# Patient Record
Sex: Female | Born: 1970 | Race: White | Hispanic: No | Marital: Married | Smoking: Never smoker
Health system: Southern US, Community
[De-identification: ages and names within clinical notes are randomized; demographics above are authoritative.]

## PROBLEM LIST (undated history)

## (undated) DIAGNOSIS — B379 Candidiasis, unspecified: Secondary | ICD-10-CM

## (undated) DIAGNOSIS — R51 Headache: Secondary | ICD-10-CM

## (undated) DIAGNOSIS — C801 Malignant (primary) neoplasm, unspecified: Secondary | ICD-10-CM

## (undated) DIAGNOSIS — Z85810 Personal history of malignant neoplasm of tongue: Secondary | ICD-10-CM

## (undated) DIAGNOSIS — B9689 Other specified bacterial agents as the cause of diseases classified elsewhere: Secondary | ICD-10-CM

## (undated) DIAGNOSIS — N760 Acute vaginitis: Secondary | ICD-10-CM

## (undated) DIAGNOSIS — G43909 Migraine, unspecified, not intractable, without status migrainosus: Secondary | ICD-10-CM

## (undated) DIAGNOSIS — Z8759 Personal history of other complications of pregnancy, childbirth and the puerperium: Secondary | ICD-10-CM

## (undated) HISTORY — DX: Migraine, unspecified, not intractable, without status migrainosus: G43.909

## (undated) HISTORY — DX: Personal history of malignant neoplasm of tongue: Z85.810

## (undated) HISTORY — DX: Candidiasis, unspecified: B37.9

## (undated) HISTORY — DX: Headache: R51

## (undated) HISTORY — DX: Personal history of other complications of pregnancy, childbirth and the puerperium: Z87.59

## (undated) HISTORY — DX: Malignant (primary) neoplasm, unspecified: C80.1

## (undated) HISTORY — DX: Other specified bacterial agents as the cause of diseases classified elsewhere: B96.89

## (undated) HISTORY — PX: APPENDECTOMY: SHX54

## (undated) HISTORY — DX: Acute vaginitis: N76.0

---

## 1997-08-19 ENCOUNTER — Other Ambulatory Visit: Admission: RE | Admit: 1997-08-19 | Discharge: 1997-08-19 | Payer: Self-pay | Admitting: Obstetrics and Gynecology

## 1998-12-09 ENCOUNTER — Other Ambulatory Visit: Admission: RE | Admit: 1998-12-09 | Discharge: 1998-12-09 | Payer: Self-pay | Admitting: Obstetrics and Gynecology

## 1999-07-19 ENCOUNTER — Other Ambulatory Visit: Admission: RE | Admit: 1999-07-19 | Discharge: 1999-07-19 | Payer: Self-pay | Admitting: *Deleted

## 1999-11-03 ENCOUNTER — Encounter: Payer: Self-pay | Admitting: Obstetrics and Gynecology

## 1999-11-03 ENCOUNTER — Ambulatory Visit (HOSPITAL_COMMUNITY): Admission: RE | Admit: 1999-11-03 | Discharge: 1999-11-03 | Payer: Self-pay | Admitting: Obstetrics and Gynecology

## 1999-11-20 ENCOUNTER — Inpatient Hospital Stay (HOSPITAL_COMMUNITY): Admission: AD | Admit: 1999-11-20 | Discharge: 1999-11-20 | Payer: Self-pay | Admitting: Obstetrics & Gynecology

## 2000-01-20 ENCOUNTER — Ambulatory Visit (HOSPITAL_COMMUNITY): Admission: RE | Admit: 2000-01-20 | Discharge: 2000-01-20 | Payer: Self-pay | Admitting: Obstetrics and Gynecology

## 2000-01-20 ENCOUNTER — Encounter: Payer: Self-pay | Admitting: Obstetrics and Gynecology

## 2000-02-12 ENCOUNTER — Inpatient Hospital Stay (HOSPITAL_COMMUNITY): Admission: AD | Admit: 2000-02-12 | Discharge: 2000-02-15 | Payer: Self-pay | Admitting: Obstetrics and Gynecology

## 2000-02-17 ENCOUNTER — Encounter: Admission: RE | Admit: 2000-02-17 | Discharge: 2000-03-28 | Payer: Self-pay | Admitting: Obstetrics & Gynecology

## 2001-03-26 ENCOUNTER — Other Ambulatory Visit: Admission: RE | Admit: 2001-03-26 | Discharge: 2001-03-26 | Payer: Self-pay | Admitting: Obstetrics and Gynecology

## 2004-03-22 ENCOUNTER — Other Ambulatory Visit: Admission: RE | Admit: 2004-03-22 | Discharge: 2004-03-22 | Payer: Self-pay | Admitting: Obstetrics and Gynecology

## 2004-07-07 ENCOUNTER — Ambulatory Visit (HOSPITAL_COMMUNITY): Admission: RE | Admit: 2004-07-07 | Discharge: 2004-07-07 | Payer: Self-pay | Admitting: Obstetrics and Gynecology

## 2004-08-02 ENCOUNTER — Ambulatory Visit (HOSPITAL_COMMUNITY): Admission: RE | Admit: 2004-08-02 | Discharge: 2004-08-02 | Payer: Self-pay | Admitting: Obstetrics and Gynecology

## 2004-11-25 ENCOUNTER — Inpatient Hospital Stay (HOSPITAL_COMMUNITY): Admission: AD | Admit: 2004-11-25 | Discharge: 2004-11-26 | Payer: Self-pay | Admitting: Obstetrics and Gynecology

## 2010-04-17 ENCOUNTER — Encounter: Payer: Self-pay | Admitting: Obstetrics and Gynecology

## 2010-09-20 ENCOUNTER — Other Ambulatory Visit (HOSPITAL_COMMUNITY): Payer: Self-pay | Admitting: Obstetrics and Gynecology

## 2010-09-20 DIAGNOSIS — Z1231 Encounter for screening mammogram for malignant neoplasm of breast: Secondary | ICD-10-CM

## 2010-09-29 ENCOUNTER — Ambulatory Visit (HOSPITAL_COMMUNITY)
Admission: RE | Admit: 2010-09-29 | Discharge: 2010-09-29 | Disposition: A | Discharge status: Home / Self Care | Payer: BC Managed Care – PPO | Source: Ambulatory Visit | Attending: Obstetrics and Gynecology | Admitting: Obstetrics and Gynecology

## 2010-09-29 DIAGNOSIS — Z1231 Encounter for screening mammogram for malignant neoplasm of breast: Secondary | ICD-10-CM | POA: Insufficient documentation

## 2011-03-28 DIAGNOSIS — C801 Malignant (primary) neoplasm, unspecified: Secondary | ICD-10-CM

## 2011-03-28 HISTORY — DX: Malignant (primary) neoplasm, unspecified: C80.1

## 2011-03-28 HISTORY — PX: TONGUE SURGERY: SHX810

## 2011-07-03 ENCOUNTER — Encounter (INDEPENDENT_AMBULATORY_CARE_PROVIDER_SITE_OTHER): Payer: BC Managed Care – PPO | Admitting: Obstetrics and Gynecology

## 2011-07-03 DIAGNOSIS — Z3041 Encounter for surveillance of contraceptive pills: Secondary | ICD-10-CM

## 2011-09-19 ENCOUNTER — Ambulatory Visit: Payer: BC Managed Care – PPO | Admitting: Obstetrics and Gynecology

## 2013-12-05 ENCOUNTER — Other Ambulatory Visit (HOSPITAL_COMMUNITY): Payer: Self-pay | Admitting: Obstetrics and Gynecology

## 2013-12-05 DIAGNOSIS — Z1231 Encounter for screening mammogram for malignant neoplasm of breast: Secondary | ICD-10-CM

## 2013-12-11 ENCOUNTER — Ambulatory Visit (HOSPITAL_COMMUNITY)
Admission: RE | Admit: 2013-12-11 | Discharge: 2013-12-11 | Disposition: A | Discharge status: Home / Self Care | Payer: BC Managed Care – PPO | Source: Ambulatory Visit | Attending: Obstetrics and Gynecology | Admitting: Obstetrics and Gynecology

## 2013-12-11 DIAGNOSIS — Z1231 Encounter for screening mammogram for malignant neoplasm of breast: Secondary | ICD-10-CM | POA: Diagnosis present

## 2015-11-09 DIAGNOSIS — E7800 Pure hypercholesterolemia, unspecified: Secondary | ICD-10-CM | POA: Diagnosis not present

## 2015-11-09 DIAGNOSIS — Z23 Encounter for immunization: Secondary | ICD-10-CM | POA: Diagnosis not present

## 2015-11-09 DIAGNOSIS — F329 Major depressive disorder, single episode, unspecified: Secondary | ICD-10-CM | POA: Diagnosis not present

## 2015-11-09 DIAGNOSIS — J309 Allergic rhinitis, unspecified: Secondary | ICD-10-CM | POA: Diagnosis not present

## 2015-11-09 DIAGNOSIS — G43909 Migraine, unspecified, not intractable, without status migrainosus: Secondary | ICD-10-CM | POA: Diagnosis not present

## 2015-12-22 ENCOUNTER — Other Ambulatory Visit: Payer: Self-pay | Admitting: Obstetrics and Gynecology

## 2015-12-22 DIAGNOSIS — Z1231 Encounter for screening mammogram for malignant neoplasm of breast: Secondary | ICD-10-CM

## 2015-12-27 ENCOUNTER — Ambulatory Visit
Admission: RE | Admit: 2015-12-27 | Discharge: 2015-12-27 | Disposition: A | Discharge status: Home / Self Care | Payer: BLUE CROSS/BLUE SHIELD | Source: Ambulatory Visit | Attending: Obstetrics and Gynecology | Admitting: Obstetrics and Gynecology

## 2015-12-27 DIAGNOSIS — Z1231 Encounter for screening mammogram for malignant neoplasm of breast: Secondary | ICD-10-CM

## 2016-01-24 DIAGNOSIS — H02831 Dermatochalasis of right upper eyelid: Secondary | ICD-10-CM | POA: Diagnosis not present

## 2016-01-24 DIAGNOSIS — H02834 Dermatochalasis of left upper eyelid: Secondary | ICD-10-CM | POA: Diagnosis not present

## 2016-01-24 DIAGNOSIS — H02423 Myogenic ptosis of bilateral eyelids: Secondary | ICD-10-CM | POA: Diagnosis not present

## 2016-01-24 DIAGNOSIS — D485 Neoplasm of uncertain behavior of skin: Secondary | ICD-10-CM | POA: Diagnosis not present

## 2016-03-16 DIAGNOSIS — J0100 Acute maxillary sinusitis, unspecified: Secondary | ICD-10-CM | POA: Diagnosis not present

## 2016-05-04 DIAGNOSIS — D485 Neoplasm of uncertain behavior of skin: Secondary | ICD-10-CM | POA: Diagnosis not present

## 2016-05-04 DIAGNOSIS — L905 Scar conditions and fibrosis of skin: Secondary | ICD-10-CM | POA: Diagnosis not present

## 2016-06-26 DIAGNOSIS — J309 Allergic rhinitis, unspecified: Secondary | ICD-10-CM | POA: Diagnosis not present

## 2016-06-26 DIAGNOSIS — E7800 Pure hypercholesterolemia, unspecified: Secondary | ICD-10-CM | POA: Diagnosis not present

## 2016-06-26 DIAGNOSIS — F329 Major depressive disorder, single episode, unspecified: Secondary | ICD-10-CM | POA: Diagnosis not present

## 2016-06-26 DIAGNOSIS — G43909 Migraine, unspecified, not intractable, without status migrainosus: Secondary | ICD-10-CM | POA: Diagnosis not present

## 2016-12-11 DIAGNOSIS — Z23 Encounter for immunization: Secondary | ICD-10-CM | POA: Diagnosis not present

## 2016-12-11 DIAGNOSIS — L719 Rosacea, unspecified: Secondary | ICD-10-CM | POA: Diagnosis not present

## 2016-12-25 DIAGNOSIS — K1321 Leukoplakia of oral mucosa, including tongue: Secondary | ICD-10-CM | POA: Diagnosis not present

## 2016-12-29 DIAGNOSIS — K1321 Leukoplakia of oral mucosa, including tongue: Secondary | ICD-10-CM | POA: Diagnosis not present

## 2017-01-01 DIAGNOSIS — J309 Allergic rhinitis, unspecified: Secondary | ICD-10-CM | POA: Diagnosis not present

## 2017-01-01 DIAGNOSIS — G43909 Migraine, unspecified, not intractable, without status migrainosus: Secondary | ICD-10-CM | POA: Diagnosis not present

## 2017-01-01 DIAGNOSIS — E7800 Pure hypercholesterolemia, unspecified: Secondary | ICD-10-CM | POA: Diagnosis not present

## 2017-01-01 DIAGNOSIS — F339 Major depressive disorder, recurrent, unspecified: Secondary | ICD-10-CM | POA: Diagnosis not present

## 2017-01-01 DIAGNOSIS — Z23 Encounter for immunization: Secondary | ICD-10-CM | POA: Diagnosis not present

## 2017-07-16 DIAGNOSIS — F339 Major depressive disorder, recurrent, unspecified: Secondary | ICD-10-CM | POA: Diagnosis not present

## 2017-07-16 DIAGNOSIS — G43909 Migraine, unspecified, not intractable, without status migrainosus: Secondary | ICD-10-CM | POA: Diagnosis not present

## 2017-07-16 DIAGNOSIS — J309 Allergic rhinitis, unspecified: Secondary | ICD-10-CM | POA: Diagnosis not present

## 2017-07-16 DIAGNOSIS — E7800 Pure hypercholesterolemia, unspecified: Secondary | ICD-10-CM | POA: Diagnosis not present

## 2017-10-29 DIAGNOSIS — R1011 Right upper quadrant pain: Secondary | ICD-10-CM | POA: Diagnosis not present

## 2017-10-29 DIAGNOSIS — R197 Diarrhea, unspecified: Secondary | ICD-10-CM | POA: Diagnosis not present

## 2017-10-31 ENCOUNTER — Other Ambulatory Visit: Payer: Self-pay | Admitting: Family Medicine

## 2017-10-31 DIAGNOSIS — R1011 Right upper quadrant pain: Secondary | ICD-10-CM

## 2017-11-01 ENCOUNTER — Ambulatory Visit
Admission: RE | Admit: 2017-11-01 | Discharge: 2017-11-01 | Disposition: A | Discharge status: Home / Self Care | Payer: BLUE CROSS/BLUE SHIELD | Source: Ambulatory Visit | Attending: Family Medicine | Admitting: Family Medicine

## 2017-11-01 DIAGNOSIS — R1011 Right upper quadrant pain: Secondary | ICD-10-CM

## 2017-12-03 DIAGNOSIS — R1011 Right upper quadrant pain: Secondary | ICD-10-CM | POA: Diagnosis not present

## 2017-12-04 ENCOUNTER — Other Ambulatory Visit (HOSPITAL_COMMUNITY): Payer: Self-pay | Admitting: Surgery

## 2017-12-04 DIAGNOSIS — R1011 Right upper quadrant pain: Secondary | ICD-10-CM

## 2017-12-24 ENCOUNTER — Encounter (HOSPITAL_COMMUNITY)
Admission: RE | Admit: 2017-12-24 | Discharge: 2017-12-24 | Disposition: A | Discharge status: Home / Self Care | Payer: BLUE CROSS/BLUE SHIELD | Source: Ambulatory Visit | Attending: Surgery | Admitting: Surgery

## 2017-12-24 DIAGNOSIS — R1011 Right upper quadrant pain: Secondary | ICD-10-CM | POA: Insufficient documentation

## 2017-12-24 DIAGNOSIS — R109 Unspecified abdominal pain: Secondary | ICD-10-CM | POA: Diagnosis not present

## 2017-12-24 DIAGNOSIS — R110 Nausea: Secondary | ICD-10-CM | POA: Diagnosis not present

## 2017-12-24 MED ORDER — TECHNETIUM TC 99M MEBROFENIN IV KIT: 5.2000 | PACK | Freq: Once | INTRAVENOUS | Status: DC | PRN

## 2017-12-24 MED ORDER — FLUDEOXYGLUCOSE F - 18 (FDG) INJECTION: 5.2000 | Freq: Once | INTRAVENOUS | Status: DC | PRN

## 2018-01-14 DIAGNOSIS — E7800 Pure hypercholesterolemia, unspecified: Secondary | ICD-10-CM | POA: Diagnosis not present

## 2018-01-14 DIAGNOSIS — J309 Allergic rhinitis, unspecified: Secondary | ICD-10-CM | POA: Diagnosis not present

## 2018-01-14 DIAGNOSIS — F339 Major depressive disorder, recurrent, unspecified: Secondary | ICD-10-CM | POA: Diagnosis not present

## 2018-01-14 DIAGNOSIS — G43909 Migraine, unspecified, not intractable, without status migrainosus: Secondary | ICD-10-CM | POA: Diagnosis not present

## 2018-01-21 DIAGNOSIS — R1011 Right upper quadrant pain: Secondary | ICD-10-CM | POA: Diagnosis not present

## 2018-07-22 DIAGNOSIS — G43909 Migraine, unspecified, not intractable, without status migrainosus: Secondary | ICD-10-CM | POA: Diagnosis not present

## 2018-07-22 DIAGNOSIS — F339 Major depressive disorder, recurrent, unspecified: Secondary | ICD-10-CM | POA: Diagnosis not present

## 2018-07-22 DIAGNOSIS — J309 Allergic rhinitis, unspecified: Secondary | ICD-10-CM | POA: Diagnosis not present

## 2018-07-22 DIAGNOSIS — E7800 Pure hypercholesterolemia, unspecified: Secondary | ICD-10-CM | POA: Diagnosis not present

## 2018-10-25 DIAGNOSIS — L719 Rosacea, unspecified: Secondary | ICD-10-CM | POA: Diagnosis not present

## 2019-11-23 IMAGING — NM NM HEPATO W/GB/PHARM/[PERSON_NAME]
2 series · 12 of 12 positions shown · non-contrast
Comparison: None.

CLINICAL DATA: Abdominal pain and nausea

EXAM:
NUCLEAR MEDICINE HEPATOBILIARY IMAGING WITH GALLBLADDER EF
VIEWS:
Anterior right upper quadrant
RADIOPHARMACEUTICALS:  5.2 mCi Oc-11m  Choletec IV

[Series 1: biliary · 3.25mm/px · 6 of 60 frames shown]
[frame 6/60]
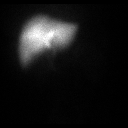
[frame 16/60]
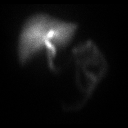
[frame 26/60]
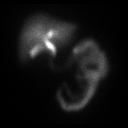
[frame 36/60]
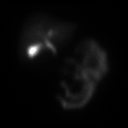
[frame 46/60]
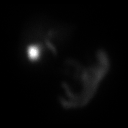
[frame 56/60]
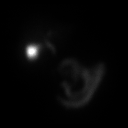

[Series 2: gbef · 3.25mm/px · 6 of 60 frames shown]
[frame 6/60]
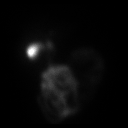
[frame 16/60]
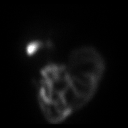
[frame 26/60]
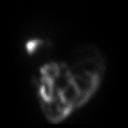
[frame 36/60]
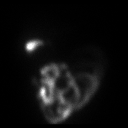
[frame 46/60]
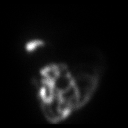
[frame 56/60]
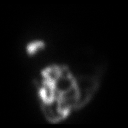

[12 of 12 positions shown; findings below may reference images not displayed]

FINDINGS: Liver uptake of radiotracer is normal. There is prompt visualization
of gallbladder and small bowel, indicating patency of the cystic and
common bile ducts. The patient consumed 8 ounces of Ensure orally
with calculation of the computer generated ejection fraction of
radiotracer from the gallbladder. The patient did not experience
clinical symptoms with the oral Ensure consumption. The computer
generated ejection fraction of radiotracer from the gallbladder is
normal at 62%, normal greater than 33% using the oral agent.
IMPRESSION: Study within normal limits.

## 2021-10-07 ENCOUNTER — Encounter: Payer: Self-pay | Admitting: Gastroenterology

## 2021-11-08 ENCOUNTER — Ambulatory Visit: Payer: BLUE CROSS/BLUE SHIELD | Admitting: Gastroenterology

## 2021-11-16 ENCOUNTER — Encounter: Payer: Self-pay | Admitting: Gastroenterology

## 2021-11-16 ENCOUNTER — Ambulatory Visit: Payer: Commercial Managed Care - HMO | Admitting: Gastroenterology

## 2021-11-16 VITALS — BP 122/82 | HR 75 | Ht 66.0 in | Wt 272.4 lb

## 2021-11-16 DIAGNOSIS — Z1211 Encounter for screening for malignant neoplasm of colon: Secondary | ICD-10-CM

## 2021-11-16 DIAGNOSIS — R194 Change in bowel habit: Secondary | ICD-10-CM

## 2021-11-16 DIAGNOSIS — K760 Fatty (change of) liver, not elsewhere classified: Secondary | ICD-10-CM

## 2021-11-16 DIAGNOSIS — R1011 Right upper quadrant pain: Secondary | ICD-10-CM

## 2021-11-16 DIAGNOSIS — R197 Diarrhea, unspecified: Secondary | ICD-10-CM

## 2021-11-16 DIAGNOSIS — R12 Heartburn: Secondary | ICD-10-CM

## 2021-11-16 DIAGNOSIS — K219 Gastro-esophageal reflux disease without esophagitis: Secondary | ICD-10-CM | POA: Diagnosis not present

## 2021-11-16 MED ORDER — DICYCLOMINE HCL 10 MG PO CAPS: 10.0000 mg | ORAL_CAPSULE | Freq: Four times a day (QID) | ORAL | 3 refills | Status: DC | PRN

## 2021-11-16 MED ORDER — CLENPIQ 10-3.5-12 MG-GM -GM/175ML PO SOLN: 1.0000 | Freq: Once | ORAL | 0 refills | Status: AC

## 2021-11-16 MED ORDER — CLENPIQ 10-3.5-12 MG-GM -GM/175ML PO SOLN: 1.0000 | Freq: Once | ORAL | 0 refills | Status: DC

## 2021-11-16 NOTE — Progress Notes (Signed)
Chief Complaint: Abdominal pain, diarrhea, GERD   HPI:     Amber Horne is a 51 y.o. female with a history of tongue cancer 2012, migraines, depression, referred to the [facility] for evaluation of abdominal pain, diarrhea, and GERD.  Reports having intermittent abdominal pain and diarrhea for the last 5+ years. Tends to be right sided abdominal pain. Can go weeks without pain, then recurs. Tends to be post prandial, and can be hours to a full day after eating exacerbating type foods. Worse with mulitple foods to include fried, artificial sweetener, potato chips, milk, garlic, onions, chocolate, greasy, spicy. Will typically have diarrhea with these episodes. No hematochezia or melena.  - 07/08/2012: Abdominal ultrasound: Increased hepatic echogenicity, otherwise normal - 11/01/2016: RUQ [place]: Minimal GB sludge, no gallstones, GB wall thickening.  Increased hepatic echogenicity - 12/24/2017: HIDA: Normal   Separately, history of GERD with index symptoms of heartburn, regurgitation for the last 3-4 years. No dysphagia. Takes OTC omeprazole 20 mg on demand, taking approximately 4-5 days/week. Now can have breakthrough with water alone.    Reviewed last set of labs from [facility]: - 05/30/2021: Normal CMP - 10/29/2017: Normal CBC  No previous EGD or colonoscopy.  No known family history of CRC, GI malignancy, liver disease, pancreatic disease, or IBD.    Past Medical History:  Diagnosis Date   Bacterial vaginosis    H/O hyperemesis gravidarum    H/O tongue cancer    Headache(784.0)    Migraines    Vulvovaginitis    Yeast infection      Past Surgical History:  Procedure Laterality Date   APPENDECTOMY     TONGUE SURGERY  2013   Family History  Problem Relation Age of Onset   Hypertension Mother    Hypertension Father    Cancer Maternal Grandmother        breast   Diabetes Paternal Grandmother    Stroke Paternal Grandmother    Colon cancer  Neg Hx    Esophageal cancer Neg Hx    Liver cancer Neg Hx    Pancreatic cancer Neg Hx    Rectal cancer Neg Hx    Social History   Tobacco Use   Smoking status: Never   Smokeless tobacco: Never  Vaping Use   Vaping Use: Never used  Substance Use Topics   Alcohol use: Never   Current Outpatient Medications  Medication Sig Dispense Refill   atenolol (TENORMIN) 25 MG tablet Take 25 mg by mouth daily.     citalopram (CELEXA) 20 MG tablet Take 20 mg by mouth daily.     fexofenadine (ALLEGRA) 180 MG tablet 1 tablet     omeprazole (PRILOSEC) 20 MG capsule Take 20 mg by mouth daily.     promethazine (PHENERGAN) 25 MG tablet Take 25 mg by mouth every 6 (six) hours as needed.     SUMAtriptan (IMITREX) 50 MG tablet ONE TABLET AT ONSET OF MIGRAINE HEADACHES MAY REPEAT IN 2 HOURS IF SYMPTOMS PERIST 30 DAYS     No current facility-administered medications for this visit.   Allergies  Allergen Reactions   Other     Cats & Elm Trees     Review of Systems: All systems reviewed and negative except where noted in HPI.     Physical Exam:    Wt Readings from Last 3 Encounters:  11/16/21 272 lb 6 oz (123.5 kg)    BP 122/82  Pulse 75   Ht 5\' 6"  (1.676 m)   Wt 272 lb 6 oz (123.5 kg)   SpO2 98%   BMI 43.96 kg/m  Constitutional:  Pleasant, in no acute distress. Psychiatric: Normal mood and affect. Behavior is normal. Cardiovascular: Normal rate, regular rhythm. No edema Pulmonary/chest: Effort normal and breath sounds normal. No wheezing, rales or rhonchi. Abdominal: Mild TTP in RUQ/RLQ without rebound or guarding.  No peritoneal signs.  Soft, nondistended. Bowel sounds active throughout. There are no masses palpable. No hepatomegaly. Neurological: Alert and oriented to person place and time. Skin: Skin is warm and dry. No rashes noted.   ASSESSMENT AND PLAN;   1) GERD 2) Heartburn - EGD to evaluate for erosive esophagitis, LES laxity, hiatal hernia along with Barrett's  Esophagus screening - Okay to continue OTC Prilosec for the time being - Continue antireflux lifestyle/dietary modifications with avoidance of exacerbating foods  3) RUQ pain 4) Diarrhea - Prior imaging studies have been essentially unrevealing for etiology.  Otherwise recent normal CMP and previously normal CBC - EGD and colonoscopy to evaluate for mucosal/luminal pathology with random and directed biopsies to r/o IBD, microscopic colitis, - Trial course of Bentyl - Avoid exacerbating foods  5) Colon cancer screening - Due for age-appropriate, average risk screening - Colonoscopy scheduled  6) Hepatic steatosis 7) Obesity (BMI 43.9) - RUQ with hepatic steatosis.  Otherwise normal liver enzymes   The indications, risks, and benefits of EGD and colonoscopy were explained to the patient in detail. Risks include but are not limited to bleeding, perforation, adverse reaction to medications, and cardiopulmonary compromise. Sequelae include but are not limited to the possibility of surgery, hospitalization, and mortality. The patient verbalized understanding and wished to proceed. All questions answered, referred to scheduler and bowel prep ordered. Further recommendations pending results of the exam.      [place] Tyrelle Raczka, DO, FACG  11/16/2021, 8:36 AM   No ref. provider found

## 2021-11-16 NOTE — Patient Instructions (Addendum)
If you are age 52 or younger, your body mass index should be between 19-25. Your Body mass index is 43.96 kg/m. If this is out of the aformentioned range listed, please consider follow up with your Primary Care Provider.   __________________________________________________________  The  GI providers would like to encourage you to use [facility] to communicate with providers for non-urgent requests or questions.  Due to long hold times on the telephone, sending your provider a message by [facility] may be a faster and more efficient way to get a response.  Please allow 48 business hours for a response.  Please remember that this is for non-urgent requests.   Due to recent changes in healthcare laws, you may see the results of your imaging and laboratory studies on MyChart before your provider has had a chance to review them.  We understand that in some cases there may be results that are confusing or concerning to you. Not all laboratory results come back in the same time frame and the provider may be waiting for multiple results in order to interpret others.  Please give [place] 48 hours in order for your provider to thoroughly review all the results before contacting the office for clarification of your results.    You have been scheduled for an endoscopy and colonoscopy. Please follow the written instructions given to you at your visit today. Please pick up your prep supplies at the pharmacy within the next 1-3 days. If you use inhalers (even only as needed), please bring them with you on the day of your procedure.   We have sent the following medications to your pharmacy for you to pick up at your convenience: Clenpiq, bentyl  Thank you for choosing me and  Gastroenterology.  Vito Cirigliano, D.O.

## 2021-11-24 ENCOUNTER — Other Ambulatory Visit: Payer: Self-pay | Admitting: Gastroenterology

## 2021-11-30 ENCOUNTER — Telehealth: Payer: Self-pay | Admitting: Gastroenterology

## 2021-11-30 NOTE — Telephone Encounter (Signed)
Left message for pt to call back  °

## 2021-11-30 NOTE — Telephone Encounter (Signed)
Patient called, states she having severe cough and would like to know if she could proceed with procedure 9/8. Requesting a call back as soon as possible. Please call to advise.

## 2021-11-30 NOTE — Telephone Encounter (Signed)
Pt states she started having congestion and coughing last week on 8/30. Pt denies fever. Pt also reports that son had similar symptoms and has been sick for the last 3 weeks and was diagnosed with a sinus infection. Per Dr. Barron Alvine, pt should follow up with PCP and then can reschedule procedure once cough and congestion clears up. Pt verbalized understanding and had no other concerns at end of call. Procedure on 9/8 cancelled.

## 2021-12-02 ENCOUNTER — Encounter: Payer: Commercial Managed Care - HMO | Admitting: Gastroenterology

## 2022-01-05 ENCOUNTER — Other Ambulatory Visit: Payer: Self-pay | Admitting: Family Medicine

## 2022-01-05 DIAGNOSIS — Z1231 Encounter for screening mammogram for malignant neoplasm of breast: Secondary | ICD-10-CM

## 2022-06-05 ENCOUNTER — Ambulatory Visit
Admission: RE | Admit: 2022-06-05 | Discharge: 2022-06-05 | Disposition: A | Discharge status: Home / Self Care | Payer: 59 | Source: Ambulatory Visit | Attending: Family Medicine | Admitting: Family Medicine

## 2022-06-05 DIAGNOSIS — Z1231 Encounter for screening mammogram for malignant neoplasm of breast: Secondary | ICD-10-CM

## 2023-02-20 ENCOUNTER — Other Ambulatory Visit (HOSPITAL_BASED_OUTPATIENT_CLINIC_OR_DEPARTMENT_OTHER): Payer: Self-pay | Admitting: Family Medicine

## 2023-02-20 ENCOUNTER — Telehealth (HOSPITAL_BASED_OUTPATIENT_CLINIC_OR_DEPARTMENT_OTHER): Payer: Self-pay

## 2023-02-20 DIAGNOSIS — E7800 Pure hypercholesterolemia, unspecified: Secondary | ICD-10-CM

## 2023-03-16 ENCOUNTER — Ambulatory Visit (HOSPITAL_BASED_OUTPATIENT_CLINIC_OR_DEPARTMENT_OTHER)
Admission: RE | Admit: 2023-03-16 | Discharge: 2023-03-16 | Disposition: A | Discharge status: Home / Self Care | Payer: Self-pay | Source: Ambulatory Visit | Attending: Family Medicine | Admitting: Family Medicine

## 2023-03-16 DIAGNOSIS — E7800 Pure hypercholesterolemia, unspecified: Secondary | ICD-10-CM | POA: Insufficient documentation

## 2023-03-19 ENCOUNTER — Telehealth: Payer: Self-pay | Admitting: Gastroenterology

## 2023-03-19 NOTE — Telephone Encounter (Signed)
PT was scheduled to have EGD/colon last year and unfortunately because of covid she was not able to go forth with having it done. Can we proceed with scheduling both procedures or will she need to come back in for doctor's recommendations. Please advise.

## 2023-03-19 NOTE — Telephone Encounter (Signed)
Dr C-  Are you okay with patient having direct endoscopy along with colonoscopy as previously recommended in 2023?

## 2023-04-24 ENCOUNTER — Ambulatory Visit (AMBULATORY_SURGERY_CENTER): Payer: 59

## 2023-04-24 VITALS — Ht 66.0 in | Wt 272.0 lb

## 2023-04-24 DIAGNOSIS — Z1211 Encounter for screening for malignant neoplasm of colon: Secondary | ICD-10-CM

## 2023-04-24 DIAGNOSIS — K219 Gastro-esophageal reflux disease without esophagitis: Secondary | ICD-10-CM

## 2023-04-24 DIAGNOSIS — R1011 Right upper quadrant pain: Secondary | ICD-10-CM

## 2023-04-24 MED ORDER — SUFLAVE 178.7 G PO SOLR: 1.0000 | ORAL | 0 refills | Status: DC

## 2023-04-24 NOTE — Progress Notes (Signed)

## 2023-05-15 ENCOUNTER — Encounter: Payer: Self-pay | Admitting: Gastroenterology

## 2023-05-17 ENCOUNTER — Encounter: Payer: Self-pay | Admitting: Gastroenterology

## 2023-05-17 ENCOUNTER — Ambulatory Visit: Payer: Self-pay | Admitting: Gastroenterology

## 2023-05-17 VITALS — BP 135/75 | HR 59 | Temp 97.3°F | Resp 14 | Ht 66.0 in | Wt 272.0 lb

## 2023-05-17 DIAGNOSIS — Z1211 Encounter for screening for malignant neoplasm of colon: Secondary | ICD-10-CM | POA: Diagnosis present

## 2023-05-17 DIAGNOSIS — K6289 Other specified diseases of anus and rectum: Secondary | ICD-10-CM

## 2023-05-17 DIAGNOSIS — K219 Gastro-esophageal reflux disease without esophagitis: Secondary | ICD-10-CM

## 2023-05-17 DIAGNOSIS — R194 Change in bowel habit: Secondary | ICD-10-CM

## 2023-05-17 DIAGNOSIS — K644 Residual hemorrhoidal skin tags: Secondary | ICD-10-CM

## 2023-05-17 DIAGNOSIS — K2100 Gastro-esophageal reflux disease with esophagitis, without bleeding: Secondary | ICD-10-CM | POA: Diagnosis not present

## 2023-05-17 DIAGNOSIS — D120 Benign neoplasm of cecum: Secondary | ICD-10-CM

## 2023-05-17 DIAGNOSIS — R197 Diarrhea, unspecified: Secondary | ICD-10-CM

## 2023-05-17 DIAGNOSIS — R1011 Right upper quadrant pain: Secondary | ICD-10-CM

## 2023-05-17 DIAGNOSIS — R12 Heartburn: Secondary | ICD-10-CM

## 2023-05-17 HISTORY — PX: COLONOSCOPY WITH ESOPHAGOGASTRODUODENOSCOPY (EGD): SHX5779

## 2023-05-17 MED ORDER — OMEPRAZOLE 40 MG PO CPDR: 40.0000 mg | DELAYED_RELEASE_CAPSULE | Freq: Every day | ORAL | 3 refills | Status: DC

## 2023-05-17 MED ORDER — SODIUM CHLORIDE 0.9 % IV SOLN: 500.0000 mL | INTRAVENOUS | Status: DC

## 2023-05-17 NOTE — Progress Notes (Signed)
 Patient states there have been no changes to medical or surgical history since time of pre-visit.

## 2023-05-17 NOTE — Progress Notes (Signed)
 GASTROENTEROLOGY PROCEDURE H&P NOTE   Primary Care Physician: Default, Provider, MD    Reason for Procedure:  GERD, abdominal pain, heartburn, regurgitation, colon cancer screening, diarrhea  Plan:    EGD, colonoscopy  Patient is appropriate for endoscopic procedure(s) in the ambulatory ([facility]) setting.  The nature of the procedure, as well as the risks, benefits, and alternatives were carefully and thoroughly reviewed with the patient. Ample time for discussion and questions allowed. The patient understood, was satisfied, and agreed to proceed.     HPI: Amber Horne is a 53 y.o. female who presents for EGD for evaluation of GERD, RUQ pain, heartburn, regurgitation, along with colonoscopy for CRC screening and diarrhea eval.  Past Medical History:  Diagnosis Date   Bacterial vaginosis    Cancer (HCC)    H/O hyperemesis gravidarum    H/O tongue cancer    Headache(784.0)    Migraines    Vulvovaginitis    Yeast infection     Past Surgical History:  Procedure Laterality Date   APPENDECTOMY     TONGUE SURGERY  2013    Prior to Admission medications   Medication Sig Start Date End Date Taking? Authorizing Provider  atenolol (TENORMIN) 25 MG tablet Take 25 mg by mouth daily. 10/22/21   [provider]  Azelaic Acid (FINACEA) 15 % gel Apply 1 Application topically 2 (two) times daily. 01/16/16   [provider]  citalopram (CELEXA) 20 MG tablet Take 20 mg by mouth daily. 10/22/21   [provider]  dicyclomine (BENTYL) 10 MG capsule Take 1 capsule (10 mg total) by mouth every 6 (six) hours as needed for spasms. Patient not taking: Reported on 04/24/2023 11/16/21   Shilee Biggs, Gae Bon V, DO  doxycycline (PERIOSTAT) 20 MG tablet Take 20 mg by mouth 2 (two) times daily as needed. 04/03/23   [provider]  fexofenadine (ALLEGRA) 180 MG tablet 1 tablet 02/15/09   [provider]  fluticasone (FLONASE) 50 MCG/ACT nasal spray Place 1 spray  into both nostrils daily.    [provider]  ibuprofen (ADVIL) 400 MG tablet Take 400 mg by mouth every 8 (eight) hours as needed.    [provider]  omeprazole (PRILOSEC) 20 MG capsule Take 20 mg by mouth daily.    [provider]  PEG 3350-KCl-NaCl-NaSulf-MgSul (SUFLAVE) 178.7 g SOLR Take 1 kit by mouth as directed. 04/24/23   Ahmaad Neidhardt V, DO  promethazine (PHENERGAN) 25 MG tablet Take 25 mg by mouth every 6 (six) hours as needed. 05/30/21   [provider]  SUMAtriptan (IMITREX) 50 MG tablet ONE TABLET AT ONSET OF MIGRAINE HEADACHES MAY REPEAT IN 2 HOURS IF SYMPTOMS PERIST 30 DAYS    [provider]    Current Outpatient Medications  Medication Sig Dispense Refill   atenolol (TENORMIN) 25 MG tablet Take 25 mg by mouth daily.     Azelaic Acid (FINACEA) 15 % gel Apply 1 Application topically 2 (two) times daily.     citalopram (CELEXA) 20 MG tablet Take 20 mg by mouth daily.     dicyclomine (BENTYL) 10 MG capsule Take 1 capsule (10 mg total) by mouth every 6 (six) hours as needed for spasms. (Patient not taking: Reported on 04/24/2023) 60 capsule 3   doxycycline (PERIOSTAT) 20 MG tablet Take 20 mg by mouth 2 (two) times daily as needed.     fexofenadine (ALLEGRA) 180 MG tablet 1 tablet     fluticasone (FLONASE) 50 MCG/ACT nasal spray Place 1  spray into both nostrils daily.     ibuprofen (ADVIL) 400 MG tablet Take 400 mg by mouth every 8 (eight) hours as needed.     omeprazole (PRILOSEC) 20 MG capsule Take 20 mg by mouth daily.     PEG 3350-KCl-NaCl-NaSulf-MgSul (SUFLAVE) 178.7 g SOLR Take 1 kit by mouth as directed. 1 each 0   promethazine (PHENERGAN) 25 MG tablet Take 25 mg by mouth every 6 (six) hours as needed.     SUMAtriptan (IMITREX) 50 MG tablet ONE TABLET AT ONSET OF MIGRAINE HEADACHES MAY REPEAT IN 2 HOURS IF SYMPTOMS PERIST 30 DAYS     No current facility-administered medications for this visit.    Allergies as of 05/17/2023 -  Review Complete 04/24/2023  Allergen Reaction Noted   Other  09/13/2011    Family History  Problem Relation Age of Onset   Hypertension Mother    Hypertension Father    Cancer Maternal Grandmother        breast   Diabetes Paternal Grandmother    Stroke Paternal Grandmother    Colon cancer Neg Hx    Esophageal cancer Neg Hx    Liver cancer Neg Hx    Pancreatic cancer Neg Hx    Rectal cancer Neg Hx    Stomach cancer Neg Hx     Social History   Socioeconomic History   Marital status: Married    Spouse name: Not on file   Number of children: Not on file   Years of education: Not on file   Highest education level: Not on file  Occupational History   Not on file  Tobacco Use   Smoking status: Never   Smokeless tobacco: Never  Vaping Use   Vaping status: Never Used  Substance and Sexual Activity   Alcohol use: Never   Drug use: Never   Sexual activity: Not on file  Other Topics Concern   Not on file  Social History Narrative   Not on file   Social Drivers of Health   Financial Resource Strain: Not on file  Food Insecurity: Not on file  Transportation Needs: Not on file  Physical Activity: Not on file  Stress: Not on file  Social Connections: Not on file  Intimate Partner Violence: Not on file    Physical Exam: Vital signs in last 24 hours: @LMP  12/03/2017 (Approximate)  GEN: NAD EYE: Sclerae anicteric ENT: MMM CV: Non-tachycardic Pulm: CTA b/l GI: Soft, NT/ND NEURO:  Alert & Oriented x 3   Doristine Locks, DO [facility]   05/17/2023 2:26 PM

## 2023-05-17 NOTE — Op Note (Signed)
 [facility] Patient Name: Amber Horne Procedure Date: 05/17/2023 2:21 PM MRN: [id number] Endoscopist: Doristine Locks , MD, [id number] Age: 53 Referring MD:  Date of Birth: [birth year] Gender: Female Account #: [id number] Procedure:                Upper GI endoscopy Indications:              Abdominal pain in the right upper quadrant,                            Heartburn, Suspected esophageal reflux, Diarrhea Medicines:                Monitored Anesthesia Care Procedure:                Pre-Anesthesia Assessment:                           - Prior to the procedure, a History and Physical                            was performed, and patient medications and                            allergies were reviewed. The patient's tolerance of                            previous anesthesia was also reviewed. The risks                            and benefits of the procedure and the sedation                            options and risks were discussed with the patient.                            All questions were answered, and informed consent                            was obtained. Prior Anticoagulants: The patient has                            taken no anticoagulant or antiplatelet agents. ASA                            Grade Assessment: III - A patient with severe                            systemic disease. After reviewing the risks and                            benefits, the patient was deemed in satisfactory                            condition to undergo the procedure.  After obtaining informed consent, the endoscope was                            passed under direct vision. Throughout the                            procedure, the patient's blood pressure, pulse, and                            oxygen saturations were monitored continuously. The                            Olympus Scope [id number] was introduced through the                             mouth, and advanced to the second part of duodenum.                            The upper GI endoscopy was accomplished without                            difficulty. The patient tolerated the procedure                            well. Scope In: Scope Out: Findings:                 LA Grade A (one or more mucosal breaks less than 5                            mm, not extending between tops of 2 mucosal folds)                            esophagitis with no bleeding was found in the lower                            third of the esophagus.                           The gastroesophageal flap valve was visualized                            endoscopically and classified as Hill Grade II                            (fold present, opens with respiration).                           The entire examined stomach was normal. Biopsies                            were taken with a cold forceps for histologya nd  Helicobacter pylori testing. Estimated blood loss                            was minimal.                           The examined duodenum was normal. Biopsies for                            histology were taken with a cold forceps for                            histology and evaluation of celiac disease.                            Estimated blood loss was minimal. Complications:            No immediate complications. Estimated Blood Loss:     Estimated blood loss was minimal. Impression:               - LA Grade A reflux esophagitis with no bleeding.                           - Gastroesophageal flap valve classified as Hill                            Grade II (fold present, opens with respiration).                           - Normal stomach. Biopsied.                           - Normal examined duodenum. Biopsied. Recommendation:           - Patient has a contact number available for                            emergencies. The signs and symptoms of potential                             delayed complications were discussed with the                            patient. Return to normal activities tomorrow.                            Written discharge instructions were provided to the                            patient.                           - Resume previous diet.                           - Continue present medications.                           -  Await pathology results.                           - Increase Prilosec (omeprazole) to 40 mg PO BID                            for 6 weeks to promote mucosal healing of erosive                            esophagitis, then can reduce back to 40 mg daily                            and titrate to the lowest efefctive dose to control                            reflux symptoms. Doristine Locks, MD 05/17/2023 3:29:51 PM

## 2023-05-17 NOTE — Progress Notes (Signed)
 Called to room to assist during endoscopic procedure.  Patient ID and intended procedure confirmed with present staff. Received instructions for my participation in the procedure from the performing physician.

## 2023-05-17 NOTE — Op Note (Signed)
 [facility] Patient Name: Amber Horne Procedure Date: 05/17/2023 2:20 PM MRN: [id number] Endoscopist: Doristine Locks , MD, [id number] Age: 53 Referring MD:  Date of Birth: 12-Apr-1970 Gender: Female Account #: [id number] Procedure:                Colonoscopy Indications:              Screening for colorectal malignant neoplasm, This                            is the patient's first colonoscopy.                           Separately, has been having intermittent non-bloody                            diarrhea. Medicines:                Monitored Anesthesia Care Procedure:                Pre-Anesthesia Assessment:                           - Prior to the procedure, a History and Physical                            was performed, and patient medications and                            allergies were reviewed. The patient's tolerance of                            previous anesthesia was also reviewed. The risks                            and benefits of the procedure and the sedation                            options and risks were discussed with the patient.                            All questions were answered, and informed consent                            was obtained. Prior Anticoagulants: The patient has                            taken no anticoagulant or antiplatelet agents. ASA                            Grade Assessment: III - A patient with severe                            systemic disease. After reviewing the risks and  benefits, the patient was deemed in satisfactory                            condition to undergo the procedure.                           After obtaining informed consent, the colonoscope                            was passed under direct vision. Throughout the                            procedure, the patient's blood pressure, pulse, and                            oxygen saturations were monitored continuously. The                             CF [id number] #[id number] was introduced through the anus                            and advanced to the the terminal ileum. The                            colonoscopy was performed without difficulty. The                            patient tolerated the procedure well. The quality                            of the bowel preparation was good. The terminal                            ileum, ileocecal valve, appendiceal orifice, and                            rectum were photographed. Scope In: 3:02:51 PM Scope Out: 3:22:48 PM Scope Withdrawal Time: 0 hours 15 minutes 28 seconds  Total Procedure Duration: 0 hours 19 minutes 57 seconds  Findings:                 Skin tags were found on perianal exam.                           A 4 mm polyp was found in the cecum. The polyp was                            sessile. The polyp was removed with a cold snare.                            Resection and retrieval were complete. Estimated                            blood loss was minimal.  Normal mucosa was found in the entire colon.                            Biopsies for histology were taken with a cold                            forceps from the right colon and left colon for                            evaluation of microscopic colitis. Estimated blood                            loss was minimal.                           Small hypertrophied anal papillae noted on                            retroflexion.                           The terminal ileum appeared normal. Complications:            No immediate complications. Estimated Blood Loss:     Estimated blood loss was minimal. Impression:               - Perianal skin tags found on perianal exam.                           - One 4 mm polyp in the cecum, removed with a cold                            snare. Resected and retrieved.                           - Normal mucosa in the entire examined colon.                             Biopsied.                           - Anal papilla(e) were hypertrophied.                           - The examined portion of the ileum was normal. Recommendation:           - Patient has a contact number available for                            emergencies. The signs and symptoms of potential                            delayed complications were discussed with the                            patient. Return to normal activities tomorrow.  Written discharge instructions were provided to the                            patient.                           - Resume previous diet.                           - Continue present medications.                           - Await pathology results.                           - Repeat colonoscopy for surveillance based on                            pathology results.                           - Return to GI office PRN. Doristine Locks, MD 05/17/2023 3:33:27 PM

## 2023-05-17 NOTE — Patient Instructions (Signed)

## 2023-05-17 NOTE — Progress Notes (Signed)
 Vss nad trans to pacu

## 2023-05-18 ENCOUNTER — Telehealth: Payer: Self-pay

## 2023-05-18 NOTE — Telephone Encounter (Signed)
  Follow up Call-     05/17/2023    2:35 PM  Call back number  Post procedure Call Back phone  # [phone]  Permission to leave phone message Yes     Patient questions:  Do you have a fever, pain , or abdominal swelling? No. Pain Score  0 *  Have you tolerated food without any problems? Yes.    Have you been able to return to your normal activities? Yes.    Do you have any questions about your discharge instructions: Diet   No. Medications  No. Follow up visit  No.  Do you have questions or concerns about your Care? No.  Actions: * If pain score is 4 or above: No action needed, pain <4.

## 2023-05-22 ENCOUNTER — Encounter: Payer: Self-pay | Admitting: Gastroenterology

## 2023-05-22 LAB — SURGICAL PATHOLOGY

## 2023-06-06 ENCOUNTER — Encounter: Payer: Self-pay | Admitting: Diagnostic Neuroimaging

## 2023-06-06 ENCOUNTER — Ambulatory Visit: Payer: BC Managed Care – PPO | Admitting: Diagnostic Neuroimaging

## 2023-06-06 VITALS — BP 144/76 | HR 60 | Ht 66.0 in | Wt 265.0 lb

## 2023-06-06 DIAGNOSIS — R200 Anesthesia of skin: Secondary | ICD-10-CM

## 2023-06-06 NOTE — Patient Instructions (Signed)
 INTERMITTENT NUMBNESS IN HANDS  - neuro exam is normal; no signs of CMT on exam; paternal cousin had positive testing for CMT (severe symptoms at young age); other family members had possible CMT and mild symptoms, but without genetic testing - consider medical genetic consult if interested in pursing testing - consider topical treatments for wrist / hand pain (dicofenac, lidocaine, ice) - consider wrist splint at night time

## 2023-06-06 NOTE — Progress Notes (Signed)
 [facility]  PATIENT: Amber Horne DOB: 1970/09/19  REFERRING CLINICIAN: Tally Joe, MD HISTORY FROM: PATIENT  REASON FOR VISIT: NEW CONSULT   HISTORICAL  CHIEF COMPLAINT:  Chief Complaint  Patient presents with   Extremity Weakness    Rm 7 alone  Pt is well, reports she has been having BUE numbness for about 4-5 years. Symptoms have been progressed over time.  CMT runs in her family.     HISTORY OF PRESENT ILLNESS:   52 year old female here for evaluation of intermittent numbness in hands.  Symptoms started around 2020.  At that time had some evaluation for carpal tunnel syndrome but apparently this was negative.  Has a family history of CMT neuropathy and a paternal cousin who had positive genetic testing.  He had severe symptoms in childhood and continued disability throughout his life.  Paternal grandmother, paternal uncle and patient's father had some mild symptoms but were not genetically tested.     REVIEW OF SYSTEMS: Full 14 system review of systems performed and negative with exception of: as per HPI.  ALLERGIES: Allergies  Allergen Reactions   Other Itching and Other (See Comments)    Cats & Elm Trees; congestion    HOME MEDICATIONS: Outpatient Medications Prior to Visit  Medication Sig Dispense Refill   atenolol (TENORMIN) 25 MG tablet Take 25 mg by mouth daily.     Azelaic Acid (FINACEA) 15 % gel Apply 1 Application topically 2 (two) times daily.     citalopram (CELEXA) 20 MG tablet Take 20 mg by mouth daily.     doxycycline (PERIOSTAT) 20 MG tablet Take 20 mg by mouth 2 (two) times daily as needed.     fexofenadine (ALLEGRA) 180 MG tablet 1 tablet     fluticasone (FLONASE) 50 MCG/ACT nasal spray Place 1 spray into both nostrils daily.     omeprazole (PRILOSEC) 40 MG capsule Take 1 capsule (40 mg total) by mouth daily. Take 40 mg twice daily for 6 weeks, then decrease to 40 mg once daily 90 capsule 3   promethazine (PHENERGAN) 25  MG tablet Take 25 mg by mouth every 6 (six) hours as needed.     SUMAtriptan (IMITREX) 50 MG tablet ONE TABLET AT ONSET OF MIGRAINE HEADACHES MAY REPEAT IN 2 HOURS IF SYMPTOMS PERIST 30 DAYS     No facility-administered medications prior to visit.    PAST MEDICAL HISTORY: Past Medical History:  Diagnosis Date   Bacterial vaginosis    Cancer (HCC) 2013   tongue   H/O hyperemesis gravidarum    H/O tongue cancer    Headache(784.0)    Migraines    Vulvovaginitis    Yeast infection     PAST SURGICAL HISTORY: Past Surgical History:  Procedure Laterality Date   APPENDECTOMY     COLONOSCOPY WITH ESOPHAGOGASTRODUODENOSCOPY (EGD)  05/17/2023   [facility]ito Cirigliano at [facility], GERD with esophagitis   TONGUE SURGERY  2013    FAMILY HISTORY: Family History  Problem Relation Age of Onset   Hypertension Mother    Hypertension Father    Cancer Maternal Grandmother        breast   Diabetes Paternal Grandmother    Stroke Paternal Grandmother    Colon cancer Neg Hx    Esophageal cancer Neg Hx    Liver cancer Neg Hx    Pancreatic cancer Neg Hx    Rectal cancer Neg Hx    Stomach cancer Neg Hx     SOCIAL HISTORY: Social History  Socioeconomic History   Marital status: Married    Spouse name: Not on file   Number of children: Not on file   Years of education: Not on file   Highest education level: Not on file  Occupational History   Not on file  Tobacco Use   Smoking status: Never   Smokeless tobacco: Never  Vaping Use   Vaping status: Never Used  Substance and Sexual Activity   Alcohol use: Never   Drug use: Never   Sexual activity: Not on file  Other Topics Concern   Not on file  Social History Narrative   Not on file   Social Drivers of Health   Financial Resource Strain: Not on file  Food Insecurity: Not on file  Transportation Needs: Not on file  Physical Activity: Not on file  Stress: Not on file  Social Connections: Not on file  Intimate Partner Violence: Not on  file     PHYSICAL EXAM  GENERAL EXAM/CONSTITUTIONAL: Vitals:  Vitals:   06/06/23 0816  BP: (!) 144/76  Pulse: 60  Weight: 265 lb (120.2 kg)  Height: 5\' 6"  (1.676 m)   Body mass index is 42.77 kg/m. Wt Readings from Last 3 Encounters:  06/06/23 265 lb (120.2 kg)  05/17/23 272 lb (123.4 kg)  04/24/23 272 lb (123.4 kg)   Patient is in no distress; well developed, nourished and groomed; neck is supple  CARDIOVASCULAR: Examination of carotid arteries is normal; no carotid bruits Regular rate and rhythm, no murmurs Examination of peripheral vascular system by observation and palpation is normal  EYES: Ophthalmoscopic exam of optic discs and posterior segments is normal; no papilledema or hemorrhages No results found.  MUSCULOSKELETAL: Gait, strength, tone, movements noted in Neurologic exam below  NEUROLOGIC: MENTAL STATUS:      No data to display         awake, alert, oriented to person, place and time recent and remote memory intact normal attention and concentration language fluent, comprehension intact, naming intact fund of knowledge appropriate  CRANIAL NERVE:  2nd - no papilledema on fundoscopic exam 2nd, 3rd, 4th, 6th - pupils equal and reactive to light, visual fields full to confrontation, extraocular muscles intact, no nystagmus 5th - facial sensation symmetric 7th - facial strength symmetric 8th - hearing intact 9th - palate elevates symmetrically, uvula midline 11th - shoulder shrug symmetric 12th - tongue protrusion midline  MOTOR:  normal bulk and tone, full strength in the BUE, BLE NORMAL ARCHES; NO HAMMERTOES; BILATERAL BUNION FORMATION AT BASE OF GREAT TOES  SENSORY:  normal and symmetric to light touch, pinprick, temperature, vibration NEGATIVE PHALENS; NEGATIVE TINELS  COORDINATION:  finger-nose-finger, fine finger movements normal  REFLEXES:  deep tendon reflexes 1+ and symmetric  GAIT/STATION:  narrow based  gait     DIAGNOSTIC DATA (LABS, IMAGING, TESTING) - I reviewed patient records, labs, notes, testing and imaging myself where available.  No results found for: "WBC", "HGB", "HCT", "MCV", "PLT" No results found for: "NA", "K", "CL", "CO2", "GLUCOSE", "BUN", "CREATININE", "CALCIUM", "PROT", "ALBUMIN", "AST", "ALT", "ALKPHOS", "BILITOT", "GFRNONAA", "GFRAA" No results found for: "CHOL", "HDL", "LDLCALC", "LDLDIRECT", "TRIG", "CHOLHDL" No results found for: "HGBA1C" No results found for: "VITAMINB12" No results found for: "TSH"     ASSESSMENT AND PLAN  53 y.o. year old female here with:   Dx:  1. Bilateral hand numbness     PLAN:  MILD INTERMITTENT NUMBNESS IN HANDS - neuro exam is normal; no signs of CMT on exam; paternal cousin  had positive testing for CMT (severe symptoms at young age); other family members had possible CMT and mild symptoms, but without genetic testing - consider medical genetic consult if interested in pursing testing - consider topical treatments for wrist / hand pain (dicofenac, lidocaine, ice) - consider wrist splint at night time  Return for pending if symptoms worsen or fail to improve, return to PCP.    Suanne Marker, MD 06/06/2023, 9:29 AM Certified in Neurology, Neurophysiology and Neuroimaging  [facility] [address], [address] [place], [place] [address] [phone]

## 2023-11-08 ENCOUNTER — Other Ambulatory Visit: Payer: Self-pay | Admitting: Gastroenterology

## 2024-01-01 ENCOUNTER — Encounter (HOSPITAL_BASED_OUTPATIENT_CLINIC_OR_DEPARTMENT_OTHER): Payer: Self-pay | Admitting: Internal Medicine

## 2024-01-01 ENCOUNTER — Ambulatory Visit (HOSPITAL_BASED_OUTPATIENT_CLINIC_OR_DEPARTMENT_OTHER): Admitting: Internal Medicine

## 2024-01-01 VITALS — BP 128/76 | HR 66 | Ht 66.0 in | Wt 278.0 lb

## 2024-01-01 DIAGNOSIS — R931 Abnormal findings on diagnostic imaging of heart and coronary circulation: Secondary | ICD-10-CM

## 2024-01-01 DIAGNOSIS — E782 Mixed hyperlipidemia: Secondary | ICD-10-CM | POA: Diagnosis not present

## 2024-01-01 NOTE — Progress Notes (Signed)
 LIPID CLINIC CONSULT NOTE  Chief Complaint:  Manage dyslipidemia  Primary Care Physician: Seabron Lenis, MD  Primary Cardiologist:  None  HPI:  Amber Horne is a 53 y.o. female who is being seen today for the evaluation of dyslipidemia at the request of Seabron Lenis, MD. this is a pleasant 53 year old female kindly referred for evaluation management of dyslipidemia.  She has a history primarily of high triglycerides.  Labs recently showed total cholesterol 213, HDL 34, triglycerides 417 and LDL 107.  She does not believe she was fasting for this sample.  She does have an elevated calcium score which was in December 2024.  This was 35.4, 92nd percentile for age and sex matched controls.  She had not previously been on any statins however had some concerns because of family members who had side effects with them.  PMHx:  Past Medical History:  Diagnosis Date   Bacterial vaginosis    Cancer (HCC) 2013   tongue   H/O hyperemesis gravidarum    H/O tongue cancer    Headache(784.0)    Migraines    Vulvovaginitis    Yeast infection     Past Surgical History:  Procedure Laterality Date   APPENDECTOMY     COLONOSCOPY WITH ESOPHAGOGASTRODUODENOSCOPY (EGD)  05/17/2023   [facility] Cirigl[facility] at [facility], GERD with esophagitis   TONGUE SURGERY  2013    FAMHx:  Family History  Problem Relation Age of Onset   Hypertension Mother    Hypertension Father    Cancer Maternal Grandmother        breast   Diabetes Paternal Grandmother    Stroke Paternal Grandmother    Hypertension Paternal Grandmother    Colon cancer Neg Hx    Esophageal cancer Neg Hx    Liver cancer Neg Hx    Pancreatic cancer Neg Hx    Rectal cancer Neg Hx    Stomach cancer Neg Hx     SOCHx:   reports that she has never smoked. She has been exposed to tobacco smoke. She has never used smokeless tobacco. She reports that she does not drink alcohol and does not use drugs.  ALLERGIES:  Allergies  Allergen Reactions    Other Itching and Other (See Comments)    Cats & Elm Trees; congestion   Amoxicillin-Pot Clavulanate     Other Reaction(s): Nausea, stomachache, and muscle pain   Cefuroxime     Other Reaction(s): nausea/fatigue   Topiramate     Other Reaction(s): sedated    ROS: Pertinent items noted in HPI and remainder of comprehensive ROS otherwise negative.  HOME MEDS: Current Outpatient Medications on File Prior to Visit  Medication Sig Dispense Refill   atenolol (TENORMIN) 25 MG tablet Take 25 mg by mouth daily.     Azelaic Acid (FINACEA) 15 % gel Apply 1 Application topically 2 (two) times daily.     citalopram (CELEXA) 20 MG tablet Take 20 mg by mouth daily.     doxycycline (PERIOSTAT) 20 MG tablet Take 20 mg by mouth 2 (two) times daily as needed.     fexofenadine (ALLEGRA) 180 MG tablet 1 tablet     fluticasone (FLONASE) 50 MCG/ACT nasal spray Place 1 spray into both nostrils daily.     omeprazole  (PRILOSEC) 40 MG capsule Take 1 capsule (40 mg total) by mouth daily. 30 capsule 3   promethazine (PHENERGAN) 25 MG tablet Take 25 mg by mouth every 6 (six) hours as needed.     SUMAtriptan (IMITREX) 50  MG tablet ONE TABLET AT ONSET OF MIGRAINE HEADACHES MAY REPEAT IN 2 HOURS IF SYMPTOMS PERIST 30 DAYS     No current facility-administered medications on file prior to visit.    LABS/IMAGING: No results found for this or any previous visit (from the past 48 hours). No results found.  LIPID PANEL: No results found for: CHOL, TRIG, HDL, CHOLHDL, VLDL, LDLCALC, LDLDIRECT  No results found for: LIPOA   WEIGHTS: Wt Readings from Last 3 Encounters:  01/01/24 278 lb (126.1 kg)  06/06/23 265 lb (120.2 kg)  05/17/23 272 lb (123.4 kg)    VITALS: BP 128/76   Pulse 66   Ht 5' 6 (1.676 m)   Wt 278 lb (126.1 kg)   LMP 12/03/2017 (Approximate)   SpO2 98%   BMI 44.87 kg/m   EXAM: Deferred  EKG: Deferred  ASSESSMENT: Mixed dyslipidemia with high triglycerides CAC  score of 35.4, 92nd percentile (02/2023)   PLAN: 1.   Ms. Wrenn has a mixed dyslipidemia with high triglycerides and evidence of age advanced coronary artery disease.  Based on that she needs further lipid-lowering.  She had recently made some dietary changes and since her previous study was not fasting I would recommend repeating lipid NMR as well as LP(a).  Either way, statin therapy would be the first-line option and we discussed it in great detail today and she may consider that.  Will discuss with her further once she has her labs repeated.  Thanks again for the kind referral.  Vinie KYM Maxcy, MD, [facility], FNLA, FACP  [place]  [facility]  Medical Director of the [facility]  [facility] Diplomate of the American Board of Clinical Lipidology Attending Cardiologist  Direct Dial: [phone]  Fax: [phone]  Website:  [email or web address] Vinie BROCKS Eddison Searls 01/01/2024, 10:12 PM

## 2024-01-01 NOTE — Patient Instructions (Signed)
 Medication Instructions:  Your physician recommends that you continue on your current medications as directed. Please refer to the Current Medication list given to you today.   Labwork: FASTING NMR/LPa SOON   Testing/Procedures: NONE  Follow-Up: TO BE DETERMINED BASED ON YOUR LABS   Any Other Special Instructions Will Be Listed Below (If Applicable).

## 2024-01-02 ENCOUNTER — Other Ambulatory Visit: Payer: Self-pay | Admitting: Gastroenterology
